# Patient Record
Sex: Male | Born: 1960 | Race: Black or African American | Hispanic: No | Marital: Married | Smoking: Never smoker
Health system: Southern US, Community
[De-identification: ages and names within clinical notes are randomized; demographics above are authoritative.]

## PROBLEM LIST (undated history)

## (undated) HISTORY — PX: CIRCUMCISION: SUR203

## (undated) HISTORY — PX: NO PAST SURGERIES: SHX2092

---

## 2014-11-28 ENCOUNTER — Encounter: Payer: Self-pay | Admitting: Gastroenterology

## 2015-01-24 ENCOUNTER — Ambulatory Visit (AMBULATORY_SURGERY_CENTER): Payer: Self-pay

## 2015-01-24 VITALS — Ht 69.0 in | Wt 196.0 lb

## 2015-01-24 DIAGNOSIS — Z1211 Encounter for screening for malignant neoplasm of colon: Secondary | ICD-10-CM

## 2015-01-24 MED ORDER — SUPREP BOWEL PREP KIT 17.5-3.13-1.6 GM/177ML PO SOLN: 1.0000 | Freq: Once | ORAL | Status: DC

## 2015-01-24 NOTE — Progress Notes (Signed)
No allergies to eggs or soy No diet/weight loss meds No home oxygen No past problems or exposure to anesthesia  No email

## 2015-02-02 ENCOUNTER — Telehealth: Payer: Self-pay | Admitting: Gastroenterology

## 2015-02-02 MED ORDER — NA SULFATE-K SULFATE-MG SULF 17.5-3.13-1.6 GM/177ML PO SOLN: 1.0000 | Freq: Once | ORAL | Status: DC

## 2015-02-02 NOTE — Telephone Encounter (Signed)
L/m for patient that prep was resent to [facility]  Looks like Darrold JunkerAngela Willet sent this in for him to the correct pharmacy on 01/24/2015

## 2015-02-07 ENCOUNTER — Encounter: Payer: Self-pay | Admitting: Gastroenterology

## 2015-02-07 ENCOUNTER — Ambulatory Visit (AMBULATORY_SURGERY_CENTER): Payer: PRIVATE HEALTH INSURANCE | Admitting: Gastroenterology

## 2015-02-07 VITALS — BP 110/75 | HR 58 | Temp 96.8°F | Resp 17 | Ht 69.0 in | Wt 196.0 lb

## 2015-02-07 DIAGNOSIS — Z1211 Encounter for screening for malignant neoplasm of colon: Secondary | ICD-10-CM | POA: Diagnosis present

## 2015-02-07 DIAGNOSIS — K648 Other hemorrhoids: Secondary | ICD-10-CM

## 2015-02-07 DIAGNOSIS — K5730 Diverticulosis of large intestine without perforation or abscess without bleeding: Secondary | ICD-10-CM

## 2015-02-07 MED ORDER — SODIUM CHLORIDE 0.9 % IV SOLN: 500.0000 mL | INTRAVENOUS | Status: DC

## 2015-02-07 NOTE — Progress Notes (Signed)
Transferred to recovery room. A/O x3, pleased with MAC.  VSS.  Report to Penny, RN. 

## 2015-02-07 NOTE — Op Note (Addendum)
[facility] [address] [place], [id number]   COLONOSCOPY PROCEDURE REPORT  PATIENT: Manuel NakaiMoore, [name]  MR#: [id number] BIRTHDATE: 10-19-1960 , 53  yrs. old GENDER: male ENDOSCOPIST: Louis Meckelobert D Henri Guedes, MD REFERRED BY: PROCEDURE DATE:  02/07/2015 PROCEDURE:   Colonoscopy, screening First Screening Colonoscopy - Avg.  risk and is 50 yrs.  old or older Yes.  Prior Negative Screening - Now for repeat screening. N/A  History of Adenoma - Now for follow-up colonoscopy & has been > or = to 3 yrs.  N/A  Polyps removed today? No Recommend repeat exam, <10 yrs? No ASA CLASS:   Class I INDICATIONS:Colorectal Neoplasm Risk Assessment for this procedure is average risk. MEDICATIONS: Monitored anesthesia care and Propofol 175 mg IV  DESCRIPTION OF PROCEDURE:   After the risks benefits and alternatives of the procedure were thoroughly explained, informed consent was obtained.  The digital rectal exam revealed no abnormalities of the rectum.   The LB [id number]  endoscope was introduced through the anus and advanced to the cecum, which was identified by both the appendix and ileocecal valve. No adverse events experienced.   The quality of the prep was (Suprep was used) excellent.  The instrument was then slowly withdrawn as the colon was fully examined. Estimated blood loss is zero unless otherwise noted in this procedure report.    COLON FINDINGS: There was mild diverticulosis noted in the ascending colon and descending colon.   The examination was otherwise normal. Internal hemorrhoids were found.  Retroflexed views revealed no abnormalities. The time to cecum = 2.5 Withdrawal time = 6.2   The scope was withdrawn and the procedure completed. COMPLICATIONS: There were no immediate complications.  ENDOSCOPIC IMPRESSION: 1.   Mild diverticulosis was noted in the ascending colon and descending colon 2.   The examination was otherwise normal  RECOMMENDATIONS: Continue  current colorectal screening recommendations for "routine risk" patients with a repeat colonoscopy in 10 years.  eSigned:  Louis Meckelobert D Ramel Tobon, MD 02/07/2015 9:22 AM Revised: 02/07/2015 9:22 AM  cc:

## 2015-02-07 NOTE — Patient Instructions (Addendum)
YOU HAD AN ENDOSCOPIC PROCEDURE TODAY AT THE [facility]:   Refer to the procedure report that was given to you for any specific questions about what was found during the examination.  If the procedure report does not answer your questions, please call your gastroenterologist to clarify.  If you requested that your care partner not be given the details of your procedure findings, then the procedure report has been included in a sealed envelope for you to review at your convenience later.  YOU SHOULD EXPECT: Some feelings of bloating in the abdomen. Passage of more gas than usual.  Walking can help get rid of the air that was put into your GI tract during the procedure and reduce the bloating. If you had a lower endoscopy (such as a colonoscopy or flexible sigmoidoscopy) you may notice spotting of blood in your stool or on the toilet paper. If you underwent a bowel prep for your procedure, you may not have a normal bowel movement for a few days.  Please Note:  You might notice some irritation and congestion in your nose or some drainage.  This is from the oxygen used during your procedure.  There is no need for concern and it should clear up in a day or so.  SYMPTOMS TO REPORT IMMEDIATELY:   Following lower endoscopy (colonoscopy or flexible sigmoidoscopy):  Excessive amounts of blood in the stool  Significant tenderness or worsening of abdominal pains  Swelling of the abdomen that is new, acute  Fever of 100F or higher    For urgent or emergent issues, a gastroenterologist can be reached at any hour by calling [phone].   DIET: Your first meal following the procedure should be a small meal and then it is ok to progress to your normal diet. Heavy or fried foods are harder to digest and may make you feel nauseous or bloated.  Likewise, meals heavy in dairy and vegetables can increase bloating.  Drink plenty of fluids but you should avoid alcoholic beverages for 24  hours.  ACTIVITY:  You should plan to take it easy for the rest of today and you should NOT DRIVE or use heavy machinery until tomorrow (because of the sedation medicines used during the test).    FOLLOW UP: Our staff will call the number listed on your records the next business day following your procedure to check on you and address any questions or concerns that you may have regarding the information given to you following your procedure. If we do not reach you, we will leave a message.  However, if you are feeling well and you are not experiencing any problems, there is no need to return our call.  We will assume that you have returned to your regular daily activities without incident.  If any biopsies were taken you will be contacted by phone or by letter within the next 1-3 weeks.  Please call us at [phone] if you have not heard about the biopsies in 3 weeks.    SIGNATURES/CONFIDENTIALITY: You and/or your care partner have signed paperwork which will be entered into your electronic medical record.  These signatures attest to the fact that that the information above on your After Visit Summary has been reviewed and is understood.  Full responsibility of the confidentiality of this discharge information lies with you and/or your care-partner.   Information on diverticulosis & high fiber diet given to you today  Over the counter hemorrhoid suppositories as needed for one wk  Banding of hemorrhoid information given to you

## 2015-02-08 ENCOUNTER — Telehealth: Payer: Self-pay | Admitting: *Deleted

## 2015-02-08 NOTE — Telephone Encounter (Signed)
  Follow up Call-  Call back number 02/07/2015  Post procedure Call Back phone  # [phone]  Permission to leave phone message Yes     Patient questions:  Do you have a fever, pain , or abdominal swelling? No. Pain Score  0 *  Have you tolerated food without any problems? Yes.    Have you been able to return to your normal activities? Yes.    Do you have any questions about your discharge instructions: Diet   No. Medications  No. Follow up visit  No.  Do you have questions or concerns about your Care? No.  Actions: * If pain score is 4 or above: No action needed, pain <4.

## 2015-03-11 ENCOUNTER — Emergency Department (HOSPITAL_COMMUNITY)
Admission: EM | Admit: 2015-03-11 | Discharge: 2015-03-11 | Disposition: A | Discharge status: Home / Self Care | Payer: No Typology Code available for payment source | Attending: Emergency Medicine | Admitting: Emergency Medicine

## 2015-03-11 DIAGNOSIS — Y92410 Unspecified street and highway as the place of occurrence of the external cause: Secondary | ICD-10-CM | POA: Diagnosis not present

## 2015-03-11 DIAGNOSIS — S46912A Strain of unspecified muscle, fascia and tendon at shoulder and upper arm level, left arm, initial encounter: Secondary | ICD-10-CM | POA: Insufficient documentation

## 2015-03-11 DIAGNOSIS — Y999 Unspecified external cause status: Secondary | ICD-10-CM | POA: Insufficient documentation

## 2015-03-11 DIAGNOSIS — Y9389 Activity, other specified: Secondary | ICD-10-CM | POA: Insufficient documentation

## 2015-03-11 DIAGNOSIS — S4992XA Unspecified injury of left shoulder and upper arm, initial encounter: Secondary | ICD-10-CM | POA: Diagnosis present

## 2015-03-11 DIAGNOSIS — T148XXA Other injury of unspecified body region, initial encounter: Secondary | ICD-10-CM

## 2015-03-11 MED ORDER — IBUPROFEN 800 MG PO TABS: 800.0000 mg | ORAL_TABLET | Freq: Three times a day (TID) | ORAL | Status: DC

## 2015-03-11 MED ORDER — CYCLOBENZAPRINE HCL 10 MG PO TABS: 10.0000 mg | ORAL_TABLET | Freq: Two times a day (BID) | ORAL | Status: DC | PRN
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
## 2015-03-21 ENCOUNTER — Encounter (HOSPITAL_COMMUNITY): Payer: Self-pay | Admitting: Emergency Medicine

## 2015-03-21 ENCOUNTER — Emergency Department (HOSPITAL_COMMUNITY)
Admission: EM | Admit: 2015-03-21 | Discharge: 2015-03-21 | Disposition: A | Discharge status: Home / Self Care | Payer: PRIVATE HEALTH INSURANCE | Attending: Emergency Medicine | Admitting: Emergency Medicine

## 2015-03-21 DIAGNOSIS — G8911 Acute pain due to trauma: Secondary | ICD-10-CM | POA: Insufficient documentation

## 2015-03-21 DIAGNOSIS — Z87898 Personal history of other specified conditions: Secondary | ICD-10-CM | POA: Insufficient documentation

## 2015-03-21 DIAGNOSIS — Z791 Long term (current) use of non-steroidal anti-inflammatories (NSAID): Secondary | ICD-10-CM | POA: Diagnosis not present

## 2015-03-21 DIAGNOSIS — M546 Pain in thoracic spine: Secondary | ICD-10-CM | POA: Insufficient documentation

## 2015-03-21 DIAGNOSIS — M25512 Pain in left shoulder: Secondary | ICD-10-CM | POA: Diagnosis not present

## 2015-03-21 MED ORDER — METHOCARBAMOL 500 MG PO TABS: 500.0000 mg | ORAL_TABLET | Freq: Three times a day (TID) | ORAL | Status: DC | PRN

## 2015-03-21 MED ORDER — TRAMADOL HCL 50 MG PO TABS: 50.0000 mg | ORAL_TABLET | Freq: Four times a day (QID) | ORAL | Status: DC | PRN
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
## 2015-03-21 NOTE — Discharge Instructions (Signed)
Back Pain, Adult Low back pain is very common. About 1 in 5 people have back pain.The cause of low back pain is rarely dangerous. The pain often gets better over time.About half of people with a sudden onset of back pain feel better in just 2 weeks. About 8 in 10 people feel better by 6 weeks.  CAUSES Some common causes of back pain include:  Strain of the muscles or ligaments supporting the spine.  Wear and tear (degeneration) of the spinal discs.  Arthritis.  Direct injury to the back. DIAGNOSIS Most of the time, the direct cause of low back pain is not known.However, back pain can be treated effectively even when the exact cause of the pain is unknown.Answering your caregiver's questions about your overall health and symptoms is one of the most accurate ways to make sure the cause of your pain is not dangerous. If your caregiver needs more information, he or she may order lab work or imaging tests (X-rays or MRIs).However, even if imaging tests show changes in your back, this usually does not require surgery. HOME CARE INSTRUCTIONS For many people, back pain returns.Since low back pain is rarely dangerous, it is often a condition that people can learn to manageon their own.   Remain active. It is stressful on the back to sit or stand in one place. Do not sit, drive, or stand in one place for more than 30 minutes at a time. Take short walks on level surfaces as soon as pain allows.Try to increase the length of time you walk each day.  Do not stay in bed.Resting more than 1 or 2 days can delay your recovery.  Do not avoid exercise or work.Your body is made to move.It is not dangerous to be active, even though your back may hurt.Your back will likely heal faster if you return to being active before your pain is gone.  Pay attention to your body when you bend and lift. Many people have less discomfortwhen lifting if they bend their knees, keep the load close to their bodies,and  avoid twisting. Often, the most comfortable positions are those that put less stress on your recovering back.  Find a comfortable position to sleep. Use a firm mattress and lie on your side with your knees slightly bent. If you lie on your back, put a pillow under your knees.  Only take over-the-counter or prescription medicines as directed by your caregiver. Over-the-counter medicines to reduce pain and inflammation are often the most helpful.Your caregiver may prescribe muscle relaxant drugs.These medicines help dull your pain so you can more quickly return to your normal activities and healthy exercise.  Put ice on the injured area.  Put ice in a plastic bag.  Place a towel between your skin and the bag.  Leave the ice on for 15-20 minutes, 03-04 times a day for the first 2 to 3 days. After that, ice and heat may be alternated to reduce pain and spasms.  Ask your caregiver about trying back exercises and gentle massage. This may be of some benefit.  Avoid feeling anxious or stressed.Stress increases muscle tension and can worsen back pain.It is important to recognize when you are anxious or stressed and learn ways to manage it.Exercise is a great option. SEEK MEDICAL CARE IF:  You have pain that is not relieved with rest or medicine.  You have pain that does not improve in 1 week.  You have new symptoms.  You are generally not feeling well. SEEK   IMMEDIATE MEDICAL CARE IF:   You have pain that radiates from your back into your legs.  You develop new bowel or bladder control problems.  You have unusual weakness or numbness in your arms or legs.  You develop nausea or vomiting.  You develop abdominal pain.  You feel faint. Document Released: 09/01/2005 Document Revised: 03/02/2012 Document Reviewed: 01/03/2014 ExitCare Patient Information 2015 [employer]. This information is not intended to replace advice given to you by your health care provider. Make sure you  discuss any questions you have with your health care provider.  
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
## 2017-07-21 ENCOUNTER — Other Ambulatory Visit: Payer: Self-pay

## 2017-07-21 ENCOUNTER — Emergency Department (HOSPITAL_COMMUNITY)
Admission: EM | Admit: 2017-07-21 | Discharge: 2017-07-21 | Disposition: A | Discharge status: Home / Self Care | Payer: PRIVATE HEALTH INSURANCE | Attending: Emergency Medicine | Admitting: Emergency Medicine

## 2017-07-21 ENCOUNTER — Encounter (HOSPITAL_COMMUNITY): Payer: Self-pay | Admitting: Emergency Medicine

## 2017-07-21 DIAGNOSIS — K0889 Other specified disorders of teeth and supporting structures: Secondary | ICD-10-CM

## 2017-07-21 DIAGNOSIS — K047 Periapical abscess without sinus: Secondary | ICD-10-CM | POA: Diagnosis not present

## 2017-07-21 DIAGNOSIS — Z79891 Long term (current) use of opiate analgesic: Secondary | ICD-10-CM | POA: Insufficient documentation

## 2017-07-21 DIAGNOSIS — Z791 Long term (current) use of non-steroidal anti-inflammatories (NSAID): Secondary | ICD-10-CM | POA: Insufficient documentation

## 2017-07-21 MED ORDER — PENICILLIN V POTASSIUM 500 MG PO TABS: 500.0000 mg | ORAL_TABLET | Freq: Four times a day (QID) | ORAL | 0 refills | Status: AC

## 2017-07-21 MED ORDER — CHLORHEXIDINE GLUCONATE 0.12 % MT SOLN: 15.0000 mL | Freq: Two times a day (BID) | OROMUCOSAL | 0 refills | Status: DC

## 2017-07-21 NOTE — Discharge Instructions (Signed)
Please take all of your antibiotics until finished!   You may develop abdominal discomfort or diarrhea from the antibiotic.  You may help offset this with probiotics which you can buy or get in yogurt. Do not eat  or take the probiotics until 2 hours after your antibiotic.  ° °Apply warm compresses to jaw throughout the day. Alternate 600 mg of ibuprofen and 500-1000 mg of Tylenol every 3 hours as needed for pain. Do not exceed 4000 mg of Tylenol daily.  You may also use warm water salt gargles, Orajel, or other over-the-counter dental pain remedies. Use Peridex mouthwash twice daily. Followup with a dentist is very important for ongoing evaluation and management of recurrent dental pain. Return to emergency department for emergent changing or worsening symptoms such as fever, worsening facial swelling, difficulty breathing or swallowing, throat tightness, or vision changes. ° °

## 2017-07-21 NOTE — ED Triage Notes (Signed)
Pt complaint of right facial abscess noted yesterday; denies denial pain; hx of same successful with I/D.

## 2017-07-21 NOTE — ED Provider Notes (Signed)
[facility]-EMERGENCY DEPT Provider Note   CSN: [id number] Arrival date & time: 07/21/17  1416     History   Chief Complaint Chief Complaint  Patient presents with  . Abscess    HPI Manuel Phillips is a 56 y.o. male who presents today with chief complaint acute onset, progressively worsening right upper dental pain and associated facial swelling since yesterday morning.  He endorses constant throbbing pain which worsens with eating and states that he needs to eat on the left side of his jaw.  Denies throat tightness, drooling, vision changes, headaches.  Pain does not radiate.  Denies fevers or chills.  He has tried Tylenol with some improvement in his pain.  He does have a dentist but has not seen his dentist for approximately 2 years.  The history is provided by the patient.    History reviewed. No pertinent past medical history.  There are no active problems to display for this patient.   Past Surgical History:  Procedure Laterality Date  . CIRCUMCISION     at age 34  . NO PAST SURGERIES         Home Medications    Prior to Admission medications   Medication Sig Start Date End Date Taking? Authorizing Provider  chlorhexidine (PERIDEX) 0.12 % solution Use as directed 15 mLs 2 (two) times daily in the mouth or throat. 07/21/17   Tressy Kunzman A, PA-C  ibuprofen (ADVIL,MOTRIN) 800 MG tablet Take 1 tablet (800 mg total) by mouth 3 (three) times daily. 03/11/15   Elpidio Anis, PA-C  methocarbamol (ROBAXIN) 500 MG tablet Take 1 tablet (500 mg total) by mouth every 8 (eight) hours as needed for muscle spasms. 03/21/15   Raeford Razor, MD  penicillin v potassium (VEETID) 500 MG tablet Take 1 tablet (500 mg total) 4 (four) times daily for 7 days by mouth. 07/21/17 07/28/17  Michela Pitcher A, PA-C  traMADol (ULTRAM) 50 MG tablet Take 1 tablet (50 mg total) by mouth every 6 (six) hours as needed. 03/21/15   Raeford Razor, MD    Family History Family History    Problem Relation Age of Onset  . Colon cancer Neg Hx     Social History Social History   Tobacco Use  . Smoking status: Never Smoker  . Smokeless tobacco: Never Used  Substance Use Topics  . Alcohol use: No    Alcohol/week: 0.0 oz  . Drug use: No     Allergies   Patient has no known allergies.   Review of Systems Review of Systems  Constitutional: Negative for chills and fever.  HENT: Positive for dental problem and facial swelling. Negative for congestion, ear pain, rhinorrhea, sinus pain, sore throat and trouble swallowing.   Eyes: Negative for visual disturbance.  Neurological: Negative for headaches.     Physical Exam Updated Vital Signs BP (!) 139/99 (BP Location: Left Arm)   Pulse 89   Temp 98.4 F (36.9 C) (Oral)   Resp 18   SpO2 97%   Physical Exam  Constitutional: He appears well-developed and well-nourished. No distress.  HENT:  Head: Normocephalic and atraumatic.  Right Ear: External ear normal.  Left Ear: External ear normal.  Nose: Nose normal.  Right-sided facial swelling of the upper cheek.  Mildly tender to palpation overlying the maxilla and ecchymotic arch.  No erythema or warmth noted.  Diffusely decaying dentition with cracked right upper canine.  Tender to percussion in this area.  Moderate signs of gingival irritation.  No trismus or sublingual abnormalities.  Posterior oropharynx is unremarkable.  Eyes: Conjunctivae and EOM are normal. Pupils are equal, round, and reactive to light. Right eye exhibits no discharge. Left eye exhibits no discharge.  Neck: Normal range of motion. Neck supple. No tracheal deviation present.  Cardiovascular: Normal rate.  Pulmonary/Chest: Effort normal.  Abdominal: He exhibits no distension.  Musculoskeletal: He exhibits no edema.  Lymphadenopathy:    He has no cervical adenopathy.  Neurological: He is alert.  Skin: Skin is warm and dry. No erythema.  Psychiatric: He has a normal mood and affect. His  behavior is normal.  Nursing note and vitals reviewed.    ED Treatments / Results  Labs (all labs ordered are listed, but only abnormal results are displayed) Labs Reviewed - No data to display  EKG  EKG Interpretation None       Radiology No results found.  Procedures Procedures (including critical care time)  Medications Ordered in ED Medications - No data to display   Initial Impression / Assessment and Plan / ED Course  I have reviewed the triage vital signs and the nursing notes.  Pertinent labs & imaging results that were available during my care of the patient were reviewed by me and considered in my medical decision making (see chart for details).      Dental pain associated with dental infection and possible dental abscess with patient afebrile, non toxic appearing and swallowing secretions well.  Airway is patent.  Will discharge with penicillin and Peridex mouthwash.  I gave patient referral to dentist and stressed the importance of dental follow up for ultimate management of dental pain.  He has a Education officer, communitydentist with whom he can follow-up with.  I have also discussed reasons to return immediately to the ER.  Patient and patient's wife verbalized understanding of and patient stable for discharge home at this time.  He has no complaints prior to discharge.   Final Clinical Impressions(s) / ED Diagnoses   Final diagnoses:  Pain, dental    ED Discharge Orders        Ordered    chlorhexidine (PERIDEX) 0.12 % solution  2 times daily     07/21/17 1454    penicillin v potassium (VEETID) 500 MG tablet  4 times daily     07/21/17 1454       Jeanie SewerFawze, Bobbiejo Ishikawa A, PA-C 07/21/17 1456    Azalia Bilisampos, Kevin, MD 07/22/17 2100

## 2019-01-28 ENCOUNTER — Other Ambulatory Visit: Payer: Self-pay

## 2019-01-28 ENCOUNTER — Encounter (HOSPITAL_COMMUNITY): Payer: Self-pay

## 2019-01-28 ENCOUNTER — Emergency Department (HOSPITAL_COMMUNITY)
Admission: EM | Admit: 2019-01-28 | Discharge: 2019-01-28 | Disposition: A | Discharge status: Home / Self Care | Payer: PRIVATE HEALTH INSURANCE | Attending: Emergency Medicine | Admitting: Emergency Medicine

## 2019-01-28 DIAGNOSIS — L259 Unspecified contact dermatitis, unspecified cause: Secondary | ICD-10-CM | POA: Insufficient documentation

## 2019-01-28 DIAGNOSIS — Z79899 Other long term (current) drug therapy: Secondary | ICD-10-CM | POA: Diagnosis not present

## 2019-01-28 DIAGNOSIS — R21 Rash and other nonspecific skin eruption: Secondary | ICD-10-CM | POA: Diagnosis present

## 2019-01-28 MED ORDER — PREDNISONE 10 MG (21) PO TBPK: ORAL_TABLET | Freq: Every day | ORAL | 0 refills | Status: DC

## 2019-01-28 MED ORDER — HYDROXYZINE HCL 10 MG PO TABS: 10.0000 mg | ORAL_TABLET | Freq: Four times a day (QID) | ORAL | 0 refills | Status: DC | PRN

## 2019-01-28 NOTE — ED Triage Notes (Signed)
Patient  c/o rash on right arm, shoulders, and back x 1 week.

## 2019-01-28 NOTE — ED Notes (Signed)
Bed: WTR5 Expected date:  Expected time:  Means of arrival:  Comments: 

## 2019-01-28 NOTE — Discharge Instructions (Addendum)
Follow up with your doctor return to ER for worsening or concerning symptoms.

## 2019-01-28 NOTE — ED Provider Notes (Signed)
[facility]-EMERGENCY DEPT Provider Note   CSN: [id number] Arrival date & time: 01/28/19  1451    History   Chief Complaint Chief Complaint  Patient presents with  . Rash    HPI Manuel Phillips is a 58 y.o. male.     [age] year old male with complaint of pruritic rash x3 to 4 days.  Rash first started on the right upper arm, spread to his low back and left shoulder.  Patient has been applying alcohol which helps with itching.  Denies changes in medications, soaps or detergent, contact with anyone with a similar rash.  No known allergies.  No history of diabetes.  No other complaints or concerns.     History reviewed. No pertinent past medical history.  There are no active problems to display for this patient.   Past Surgical History:  Procedure Laterality Date  . CIRCUMCISION     at age 58  . NO PAST SURGERIES          Home Medications    Prior to Admission medications   Medication Sig Start Date End Date Taking? Authorizing Provider  chlorhexidine (PERIDEX) 0.12 % solution Use as directed 15 mLs 2 (two) times daily in the mouth or throat. 07/21/17   Fawze, Mina A, PA-C  hydrOXYzine (ATARAX/VISTARIL) 10 MG tablet Take 1 tablet (10 mg total) by mouth every 6 (six) hours as needed for itching. 01/28/19   Jeannie FendMurphy, Kimberlin Scheel A, PA-C  ibuprofen (ADVIL,MOTRIN) 800 MG tablet Take 1 tablet (800 mg total) by mouth 3 (three) times daily. 03/11/15   Elpidio AnisUpstill, Shari, PA-C  methocarbamol (ROBAXIN) 500 MG tablet Take 1 tablet (500 mg total) by mouth every 8 (eight) hours as needed for muscle spasms. 03/21/15   Raeford RazorKohut, Stephen, MD  predniSONE (STERAPRED UNI-PAK 21 TAB) 10 MG (21) TBPK tablet Take by mouth daily. Take 6 tabs by mouth daily  for 2 days, then 5 tabs for 2 days, then 4 tabs for 2 days, then 3 tabs for 2 days, 2 tabs for 2 days, then 1 tab by mouth daily for 2 days 01/28/19   Jeannie FendMurphy, Kimika Streater A, PA-C  traMADol (ULTRAM) 50 MG tablet Take 1 tablet (50 mg total) by mouth  every 6 (six) hours as needed. 03/21/15   Raeford RazorKohut, Stephen, MD    Family History Family History  Problem Relation Age of Onset  . Heart failure Father   . Colon cancer Neg Hx     Social History Social History   Tobacco Use  . Smoking status: Never Smoker  . Smokeless tobacco: Never Used  Substance Use Topics  . Alcohol use: No    Alcohol/week: 0.0 standard drinks  . Drug use: No     Allergies   Patient has no known allergies.   Review of Systems Review of Systems  Constitutional: Negative for fever.  Musculoskeletal: Negative for arthralgias, joint swelling and myalgias.  Skin: Positive for rash. Negative for wound.  Allergic/Immunologic: Negative for immunocompromised state.  Neurological: Negative for weakness.  Hematological: Negative for adenopathy.  All other systems reviewed and are negative.    Physical Exam Updated Vital Signs BP 126/84 (BP Location: Right Arm)   Pulse 87   Temp 98.1 F (36.7 C) (Oral)   Resp 16   Ht 5\' 8"  (1.727 m)   Wt 86.2 kg   SpO2 99%   BMI 28.89 kg/m   Physical Exam Vitals signs and nursing note reviewed.  Constitutional:      General: He  is not in acute distress.    Appearance: He is well-developed. He is not diaphoretic.  HENT:     Head: Normocephalic and atraumatic.  Pulmonary:     Effort: Pulmonary effort is normal.  Skin:    General: Skin is warm and dry.     Findings: Rash present. No erythema.     Comments: Papular rash to right upper arm, across low back, left posterior shoulder.  Neurological:     Mental Status: He is alert and oriented to person, place, and time.  Psychiatric:        Behavior: Behavior normal.      ED Treatments / Results  Labs (all labs ordered are listed, but only abnormal results are displayed) Labs Reviewed - No data to display  EKG None  Radiology No results found.  Procedures Procedures (including critical care time)  Medications Ordered in ED Medications - No data to  display   Initial Impression / Assessment and Plan / ED Course  I have reviewed the triage vital signs and the nursing notes.  Pertinent labs & imaging results that were available during my care of the patient were reviewed by me and considered in my medical decision making (see chart for details).  Clinical Course as of Jan 27 1521  Fri Jan 28, 2019  1520 58 yo male with rash x 3-4 days, on exam papular rash to right upper arm, across low back, left posterior shoulder without signs of secondary infection. Patient given prednisone dose pack with atarax for itching, recommend recheck with PCP if rash persists, return to ER for worsening or new symptoms.    [LM]    Clinical Course User Index [LM] Jeannie Fend, PA-C      Final Clinical Impressions(s) / ED Diagnoses   Final diagnoses:  Contact dermatitis, unspecified contact dermatitis type, unspecified trigger    ED Discharge Orders         Ordered    predniSONE (STERAPRED UNI-PAK 21 TAB) 10 MG (21) TBPK tablet  Daily     01/28/19 1516    hydrOXYzine (ATARAX/VISTARIL) 10 MG tablet  Every 6 hours PRN     01/28/19 1516           Jeannie Fend, PA-C 01/28/19 1522    Melene Plan, DO 01/31/19 1123

## 2019-08-04 ENCOUNTER — Ambulatory Visit: Payer: Self-pay | Admitting: Family Medicine

## 2019-08-04 NOTE — Progress Notes (Signed)
Virtual Visit via Telephone Note  I connected with Manuel Phillips on 08/04/19 at 25 AM by telephone and verified that I am speaking with the correct person using two identifiers.  Location: Patient: Home  Provider: Clinic   I discussed the limitations, risks, security and privacy concerns of performing an evaluation and management service by telephone and the availability of in person appointments. I also discussed with the patient that there may be a patient responsible charge related to this service. The patient expressed understanding and agreed to proceed.   History of Present Illness: Manuel Phillips reports that he has been having a sore throat and cough since yesterday. Denies fever or shortness of breath. No chest pain. Reports nose bleed this morning. Stopped easily. He states he is concerned about Covid-19 and is heading to a clinic to be tested currently.    Observations/Objective: He reports temp this morning was 97.5. He is speaking in complete sentences without difficulty. He is alert and oriented. Answering questions appropriately.   Assessment and Plan: 1. Recommend Covid-19 testing. He is headed to a testing site now. He will call me with the results when he has that available.  2. He may take otc robitussin for cough relief. Saline nasal spray for congestion. Recommend humidifier at night. Increase fluid intake.  3. He should go to the ED if he develops shortness of breath, chest pain, altered mental status. He verbalized understanding. 4. Recommend quarantine at home until results are known. Wear mask when necessary. Good hand hygiene.   Follow Up Instructions:    I discussed the assessment and treatment plan with the patient. The patient was provided an opportunity to ask questions and all were answered. The patient agreed with the plan and demonstrated an understanding of the instructions.   The patient was advised to call back or seek an in-person evaluation if the symptoms  worsen or if the condition fails to improve as anticipated.  I provided 10 minutes of non-face-to-face time during this encounter.   Cheyenne Adas, NP

## 2020-01-05 ENCOUNTER — Encounter (HOSPITAL_COMMUNITY): Payer: Self-pay | Admitting: Emergency Medicine

## 2020-01-05 ENCOUNTER — Other Ambulatory Visit: Payer: Self-pay

## 2020-01-05 ENCOUNTER — Emergency Department (HOSPITAL_COMMUNITY): Payer: PRIVATE HEALTH INSURANCE

## 2020-01-05 ENCOUNTER — Emergency Department (HOSPITAL_COMMUNITY)
Admission: EM | Admit: 2020-01-05 | Discharge: 2020-01-06 | Disposition: A | Discharge status: Home / Self Care | Payer: Self-pay | Attending: Emergency Medicine | Admitting: Emergency Medicine

## 2020-01-05 DIAGNOSIS — M545 Low back pain, unspecified: Secondary | ICD-10-CM

## 2020-01-05 DIAGNOSIS — R3129 Other microscopic hematuria: Secondary | ICD-10-CM | POA: Insufficient documentation

## 2020-01-05 DIAGNOSIS — R1032 Left lower quadrant pain: Secondary | ICD-10-CM | POA: Insufficient documentation

## 2020-01-05 DIAGNOSIS — R109 Unspecified abdominal pain: Secondary | ICD-10-CM

## 2020-01-05 LAB — BASIC METABOLIC PANEL
Anion gap: 9 (ref 5–15)
BUN: 21 mg/dL — ABNORMAL HIGH (ref 6–20)
CO2: 27 mmol/L (ref 22–32)
Calcium: 9.1 mg/dL (ref 8.9–10.3)
Chloride: 104 mmol/L (ref 98–111)
Creatinine, Ser: 1.35 mg/dL — ABNORMAL HIGH (ref 0.61–1.24)
GFR calc Af Amer: >60 mL/min (ref >60)
GFR calc non Af Amer: 58 mL/min — ABNORMAL LOW (ref >60)
Glucose, Bld: 151 mg/dL — ABNORMAL HIGH (ref 70–99)
Potassium: 3.9 mmol/L (ref 3.5–5.1)
Sodium: 140 mmol/L (ref 135–145)

## 2020-01-05 LAB — CBC
HCT: 47.5 % (ref 39.0–52.0)
Hemoglobin: 15.5 g/dL (ref 13.0–17.0)
MCH: 28.2 pg (ref 26.0–34.0)
MCHC: 32.6 g/dL (ref 30.0–36.0)
MCV: 86.5 fL (ref 80.0–100.0)
Platelets: 247 10*3/uL (ref 150–400)
RBC: 5.49 MIL/uL (ref 4.22–5.81)
RDW: 15.2 % (ref 11.5–15.5)
WBC: 5.9 10*3/uL (ref 4.0–10.5)
nRBC: 0 % (ref 0.0–0.2)

## 2020-01-05 LAB — TROPONIN I (HIGH SENSITIVITY): Troponin I (High Sensitivity): 10 ng/L (ref <18)

## 2020-01-05 LAB — URINALYSIS, ROUTINE W REFLEX MICROSCOPIC
Bilirubin Urine: NEGATIVE
Glucose, UA: NEGATIVE mg/dL
Ketones, ur: 5 mg/dL — AB
Leukocytes,Ua: NEGATIVE
Nitrite: NEGATIVE
Protein, ur: NEGATIVE mg/dL
Specific Gravity, Urine: 1.031 — ABNORMAL HIGH (ref 1.005–1.030)
pH: 5 (ref 5.0–8.0)

## 2020-01-05 MED ORDER — SODIUM CHLORIDE 0.9% FLUSH: 3.0000 mL | Freq: Once | INTRAVENOUS | Status: DC

## 2020-01-05 NOTE — ED Triage Notes (Signed)
Patient complaining of back pain that is wrapping around to his mid chest. Patient states that the pain started on Monday.

## 2020-01-06 ENCOUNTER — Emergency Department (HOSPITAL_COMMUNITY): Payer: PRIVATE HEALTH INSURANCE

## 2020-01-06 MED ORDER — CYCLOBENZAPRINE HCL 10 MG PO TABS
10.0000 mg | ORAL_TABLET | Freq: Once | ORAL | Status: AC
  Administered 2020-01-06: 10 mg via ORAL
  Filled 2020-01-06: qty 1

## 2020-01-06 MED ORDER — HYDROCODONE-ACETAMINOPHEN 5-325 MG PO TABS
1.0000 | ORAL_TABLET | Freq: Once | ORAL | Status: AC
Start: 2020-01-06 — End: 2020-01-06
  Administered 2020-01-06: 1 via ORAL
  Filled 2020-01-06: qty 1

## 2020-01-06 MED ORDER — CYCLOBENZAPRINE HCL 10 MG PO TABS: 10.0000 mg | ORAL_TABLET | Freq: Three times a day (TID) | ORAL | 0 refills | Status: DC | PRN

## 2020-01-06 MED ORDER — HYDROCODONE-ACETAMINOPHEN 5-325 MG PO TABS: 1.0000 | ORAL_TABLET | Freq: Four times a day (QID) | ORAL | 0 refills | Status: DC | PRN

## 2020-01-06 NOTE — ED Provider Notes (Signed)
[facility] DEPT Provider Note: Georgena Spurling, MD, FACEP  CSN: [id number] MRN: [id number] ARRIVAL: 01/05/20 at 2029 ROOM: [place]  Back Pain   HISTORY OF PRESENT ILLNESS  01/06/20 12:38 AM Manuel Phillips is a 59 y.o. male with a 4-day history of back pain.  The back pain is located in the mid lumbar region.  He rates it as a 10 out of 10 at its worst.  It is exacerbated by movement.  It radiates around to his left lower ribs.  He describes the pain is aching in nature.  He finds it difficult to sleep because he cannot get a comfortable position.  He has no pain in his anterior chest or in his abdomen.  He is not short of breath.  He has been taking naproxen without adequate relief.    History reviewed. No pertinent past medical history.  Past Surgical History:  Procedure Laterality Date  . CIRCUMCISION     at age 9  . NO PAST SURGERIES      Family History  Problem Relation Age of Onset  . Heart failure Father   . Colon cancer Neg Hx     Social History   Tobacco Use  . Smoking status: Never Smoker  . Smokeless tobacco: Never Used  Substance Use Topics  . Alcohol use: No    Alcohol/week: 0.0 standard drinks  . Drug use: No    Prior to Admission medications   Medication Sig Start Date End Date Taking? Authorizing Provider  cyclobenzaprine (FLEXERIL) 10 MG tablet Take 1 tablet (10 mg total) by mouth 3 (three) times daily as needed for muscle spasms. 01/06/20   Valerya Maxton, MD  HYDROcodone-acetaminophen (NORCO) 5-325 MG tablet Take 1 tablet by mouth every 6 (six) hours as needed (for pain). 01/06/20   Kendryck Lacroix, MD    Allergies Daucus carota   REVIEW OF SYSTEMS  Negative except as noted here or in the History of Present Illness.   PHYSICAL EXAMINATION  Initial Vital Signs Blood pressure (!) 154/106, pulse 90, temperature 98 F (36.7 C), temperature source Oral, resp. rate (!) 23, height 5\' 8"  (1.727 m), weight 89.4 kg, SpO2 100  %.  Examination General: Well-developed, well-nourished male in no acute distress; appearance consistent with age of record HENT: normocephalic; atraumatic Eyes: pupils equal, round and reactive to light; extraocular muscles intact Neck: supple Heart: regular rate and rhythm Lungs: clear to auscultation bilaterally Chest: Left lower posterior lateral chest wall tenderness Back: Mid lumbar tenderness Abdomen: soft; nondistended; nontender; no masses or hepatosplenomegaly; bowel sounds present Extremities: No deformity; full range of motion; pulses normal Neurologic: Awake, alert and oriented; motor function intact in all extremities and symmetric; no facial droop Skin: Warm and dry Psychiatric: Normal mood and affect   RESULTS  Summary of this visit's results, reviewed and interpreted by myself:   EKG Interpretation  Date/Time:    Ventricular Rate:    PR Interval:    QRS Duration:   QT Interval:    QTC Calculation:   R Axis:     Text Interpretation:        Laboratory Studies: Results for orders placed or performed during the hospital encounter of 01/05/20 (from the past 24 hour(s))  Basic metabolic panel     Status: Abnormal   Collection Time: 01/05/20  8:56 PM  Result Value Ref Range   Sodium 140 135 - 145 mmol/L   Potassium 3.9 3.5 - 5.1 mmol/L   Chloride  104 98 - 111 mmol/L   CO2 27 22 - 32 mmol/L   Glucose, Bld 151 (H) 70 - 99 mg/dL   BUN 21 (H) 6 - 20 mg/dL   Creatinine, Ser 1.77 (H) 0.61 - 1.24 mg/dL   Calcium 9.1 8.9 - 93.9 mg/dL   GFR calc non Af Amer 58 (L) >60 mL/min   GFR calc Af Amer >60 >60 mL/min   Anion gap 9 5 - 15  CBC     Status: None   Collection Time: 01/05/20  8:56 PM  Result Value Ref Range   WBC 5.9 4.0 - 10.5 K/uL   RBC 5.49 4.22 - 5.81 MIL/uL   Hemoglobin 15.5 13.0 - 17.0 g/dL   HCT 03.0 09.2 - 33.0 %   MCV 86.5 80.0 - 100.0 fL   MCH 28.2 26.0 - 34.0 pg   MCHC 32.6 30.0 - 36.0 g/dL   RDW 07.6 22.6 - 33.3 %   Platelets 247 150 - 400  K/uL   nRBC 0.0 0.0 - 0.2 %  Troponin I (High Sensitivity)     Status: None   Collection Time: 01/05/20  8:56 PM  Result Value Ref Range   Troponin I (High Sensitivity) 10 <18 ng/L  Urinalysis, Routine w reflex microscopic     Status: Abnormal   Collection Time: 01/05/20 10:42 PM  Result Value Ref Range   Color, Urine YELLOW YELLOW   APPearance CLEAR CLEAR   Specific Gravity, Urine 1.031 (H) 1.005 - 1.030   pH 5.0 5.0 - 8.0   Glucose, UA NEGATIVE NEGATIVE mg/dL   Hgb urine dipstick MODERATE (A) NEGATIVE   Bilirubin Urine NEGATIVE NEGATIVE   Ketones, ur 5 (A) NEGATIVE mg/dL   Protein, ur NEGATIVE NEGATIVE mg/dL   Nitrite NEGATIVE NEGATIVE   Leukocytes,Ua NEGATIVE NEGATIVE   RBC / HPF 21-50 0 - 5 RBC/hpf   WBC, UA 0-5 0 - 5 WBC/hpf   Bacteria, UA RARE (A) NONE SEEN   Mucus PRESENT    Imaging Studies: DG Chest 2 View  Result Date: 01/05/2020 CLINICAL DATA:  Chest pain tonight. EXAM: CHEST - 2 VIEW COMPARISON:  None. FINDINGS: Cardiac silhouette is normal in size. No mediastinal or hilar masses or evidence of adenopathy. Clear lungs.  No pleural effusion or pneumothorax. Skeletal structures are unremarkable. IMPRESSION: No active cardiopulmonary disease. Electronically Signed   By: Amie Portland M.D.   On: 01/05/2020 20:51   CT Renal Stone Study  Result Date: 01/06/2020 CLINICAL DATA:  Flank pain. Patient reports back pain for 4 days. 1 Microhematuria. EXAM: CT ABDOMEN AND PELVIS WITHOUT CONTRAST TECHNIQUE: Multidetector CT imaging of the abdomen and pelvis was performed following the standard protocol without IV contrast. COMPARISON:  None. FINDINGS: Lower chest: Minor right lower lobe atelectasis. No pleural fluid. Hepatobiliary: No focal liver abnormality is seen. Elongated left lobe. Gallbladder is nondistended. No gallstones, gallbladder wall thickening, or biliary dilatation. Pancreas: No ductal dilatation or inflammation. Spleen: Normal in size without focal abnormality.  Adrenals/Urinary Tract: No adrenal nodule. No renal stones or hydronephrosis. No significant perinephric edema. No obvious focal renal mass on noncontrast exam. Both ureters are decompressed without stones along the course. Urinary bladder is physiologically distended. No bladder wall thickening or stone. No urethral stone. Stomach/Bowel: Ingested material in the stomach. No bowel obstruction or inflammation. Normal appendix. Moderate colonic stool burden. No colonic wall thickening. Mild sigmoid colonic tortuosity. Vascular/Lymphatic: Mild aortic atherosclerosis without aneurysm. Small retroperitoneal nodes, not enlarged by size criteria. No bulky  adenopathy. Reproductive: Prominent prostate gland spans 5.4 cm. Other: No free air, free fluid, or intra-abdominal fluid collection. Fall fat containing umbilical hernia. Musculoskeletal: There are no acute or suspicious osseous abnormalities. Partial fusion of the right sacroiliac joint. IMPRESSION: 1. No renal stones or obstructive uropathy. No acute abnormality in the abdomen/pelvis. 2. Mildly enlarged prostate gland. Aortic Atherosclerosis (ICD10-I70.0). Electronically Signed   By: Narda Rutherford M.D.   On: 01/06/2020 01:18    ED COURSE and MDM  Nursing notes, initial and subsequent vitals signs, including pulse oximetry, reviewed and interpreted by myself.  Vitals:   01/05/20 2040  BP: (!) 154/106  Pulse: 90  Resp: (!) 23  Temp: 98 F (36.7 C)  TempSrc: Oral  SpO2: 100%  Weight: 89.4 kg  Height: 5\' 8"  (1.727 m)   Medications  sodium chloride flush (NS) 0.9 % injection 3 mL (has no administration in time range)   1:38 AM Patient's pain is musculoskeletal in nature, originating in his mid lumbar region but also involving his left flank.  A CT scan shows no evidence of left renal stone despite microscopic hematuria on urinalysis.  We will treat for musculoskeletal pain and refer to urology for work-up of the hematuria.   PROCEDURES   Procedures   ED DIAGNOSES     ICD-10-CM   1. Acute midline low back pain without sciatica  M54.5   2. Left flank pain  R10.9   3. Microscopic hematuria  R31.29        , MD 01/06/20 [phone]

## 2021-03-03 IMAGING — CT CT RENAL STONE PROTOCOL
2 of 4 series · 16 of 46 positions shown, 18 images · non-contrast
Comparison: None.

CLINICAL DATA: Flank pain. Patient reports back pain for 4 days. 1
Microhematuria.

EXAM:
CT ABDOMEN AND PELVIS WITHOUT CONTRAST
TECHNIQUE: Multidetector CT imaging of the abdomen and pelvis was performed
following the standard protocol without IV contrast.

[Series 2: axial st · axial · 0.66mm/px · z∈[+1150,+1526]mm · 13 of 85 slices shown, 15 images]
[im 5/85  soft-tissue]
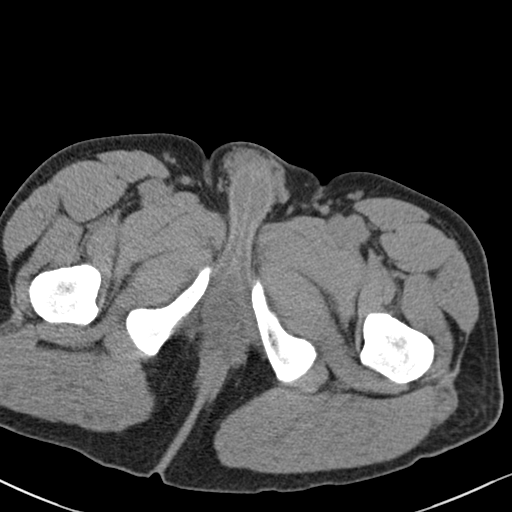
[im 5/85  bone]
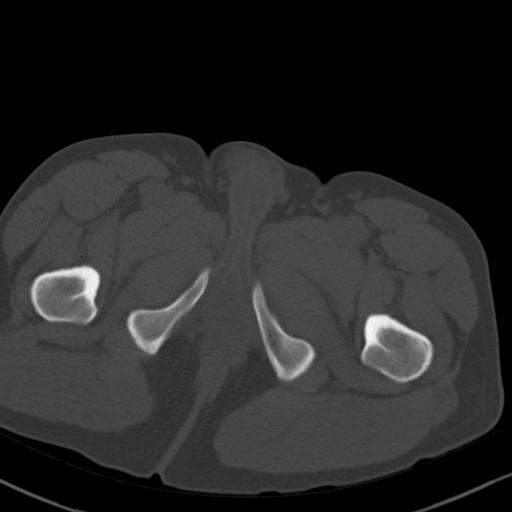
[im 13/85  soft-tissue]
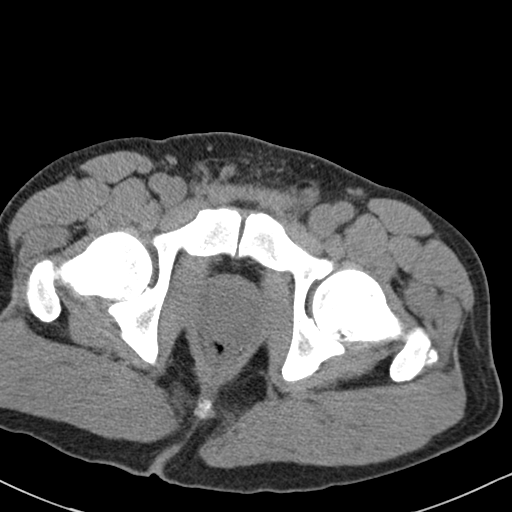
[im 17/85  soft-tissue]
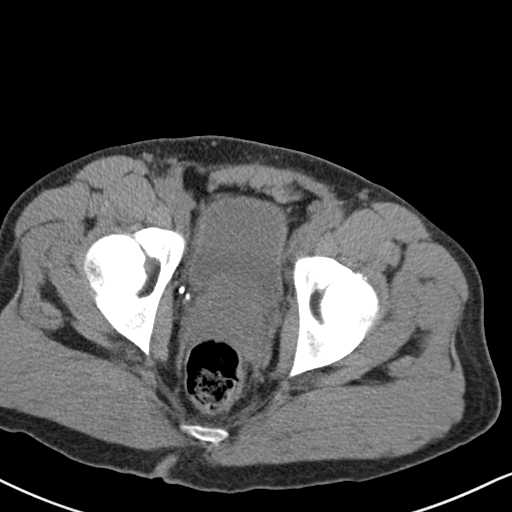
[im 26/85  soft-tissue]
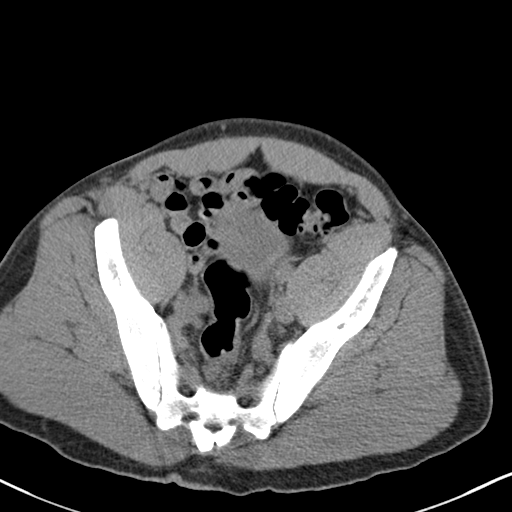
[im 30/85  soft-tissue]
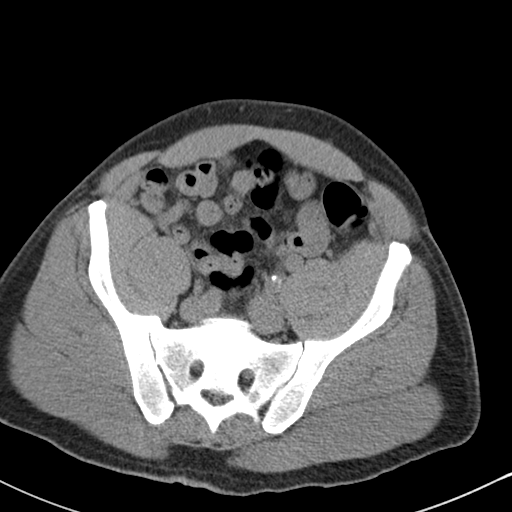
[im 38/85  soft-tissue]
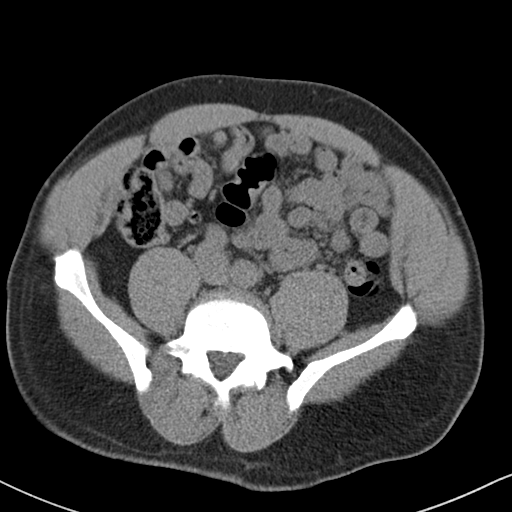
[im 43/85  soft-tissue]
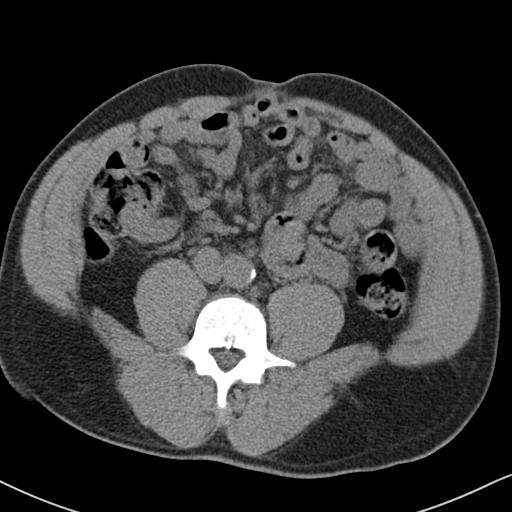
[im 47/85  soft-tissue]
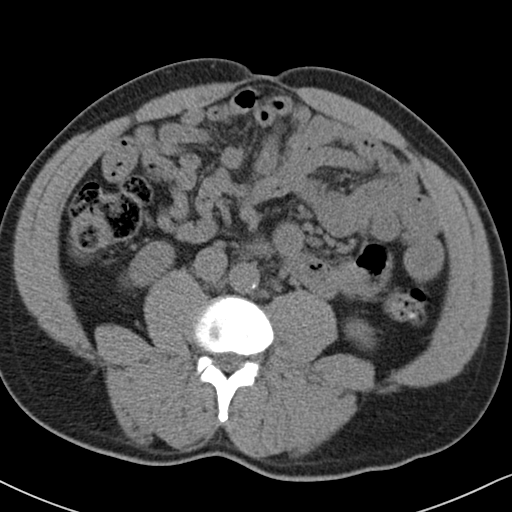
[im 55/85  soft-tissue]
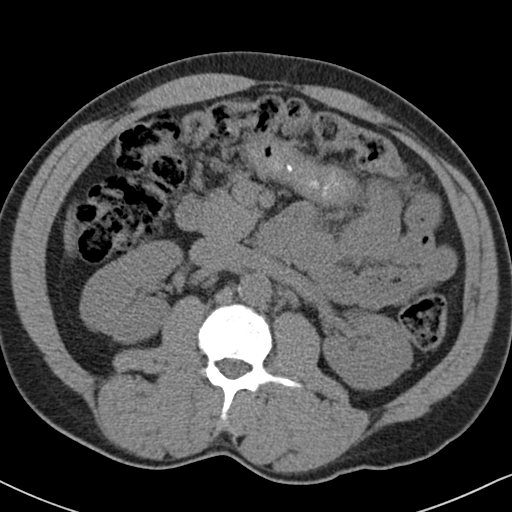
[im 55/85  bone]
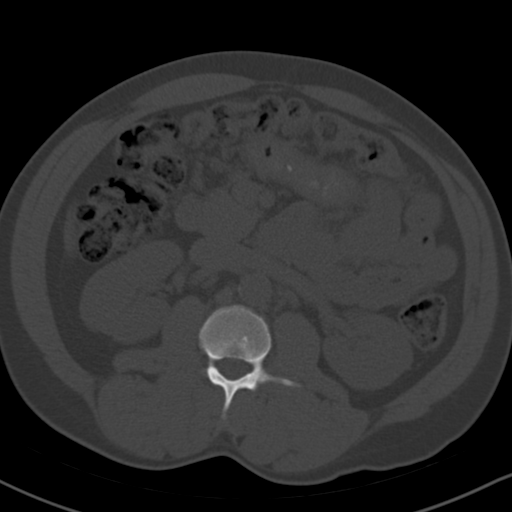
[im 59/85  soft-tissue]
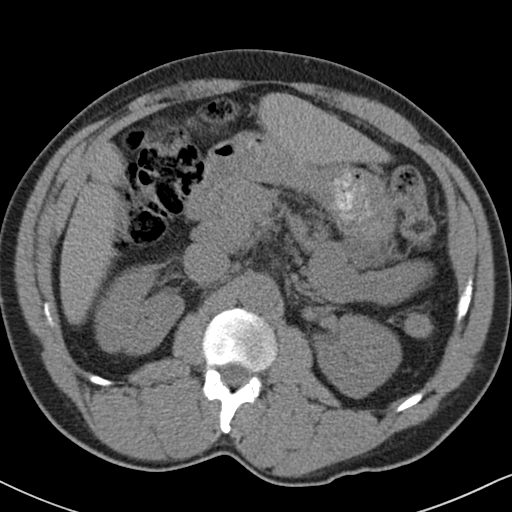
[im 68/85  soft-tissue]
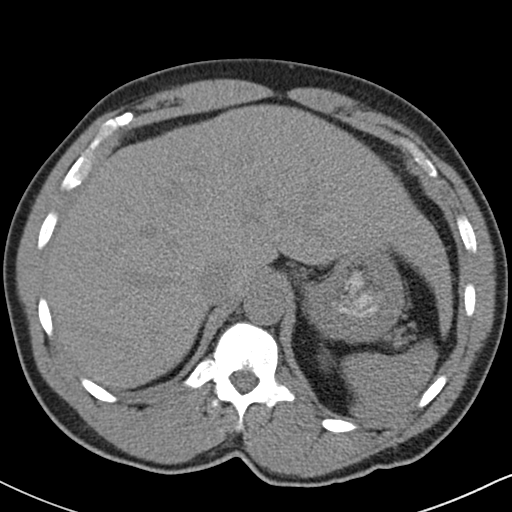
[im 72/85  soft-tissue]
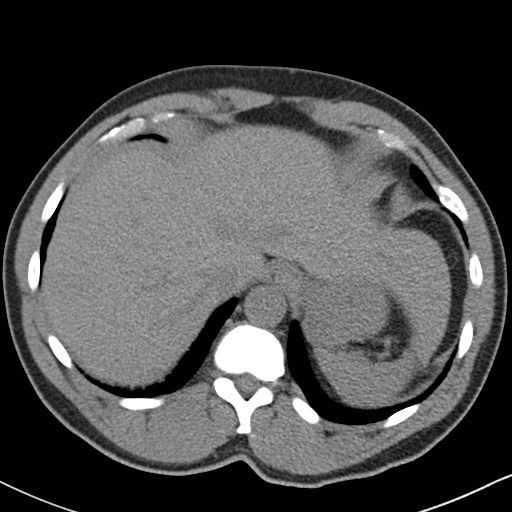
[im 80/85  soft-tissue]
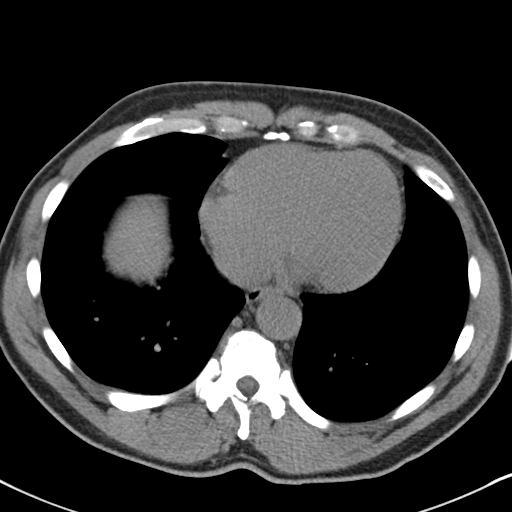

[Series 4: coronal · coronal · 0.71mm/px · 3 of 135 slices shown]
[im 45/135  soft-tissue]
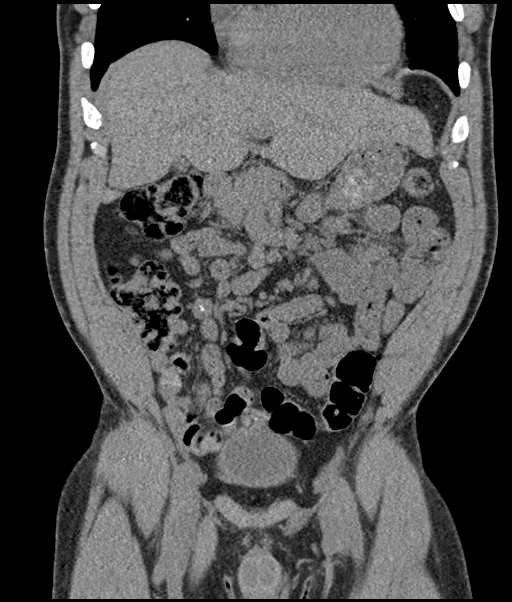
[im 60/135  soft-tissue]
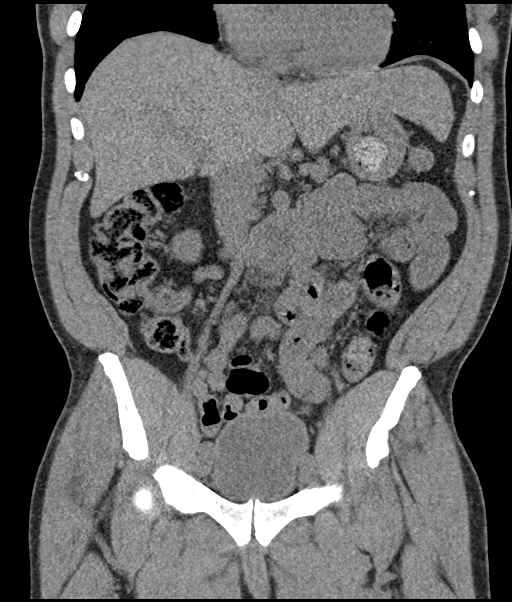
[im 75/135  soft-tissue]
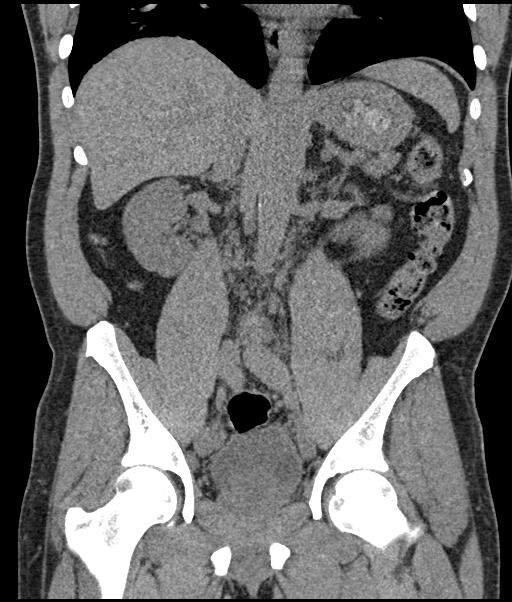

[16 of 46 positions shown; findings below may reference images not displayed]

FINDINGS: Lower chest: Minor right lower lobe atelectasis. No pleural fluid.

Hepatobiliary: No focal liver abnormality is seen. Elongated left
lobe. Gallbladder is nondistended. No gallstones, gallbladder wall
thickening, or biliary dilatation.

Pancreas: No ductal dilatation or inflammation.

Spleen: Normal in size without focal abnormality.

Adrenals/Urinary Tract: No adrenal nodule. No renal stones or
hydronephrosis. No significant perinephric edema. No obvious focal
renal mass on noncontrast exam. Both ureters are decompressed
without stones along the course. Urinary bladder is physiologically
distended. No bladder wall thickening or stone. No urethral stone.

Stomach/Bowel: Ingested material in the stomach. No bowel
obstruction or inflammation. Normal appendix. Moderate colonic stool
burden. No colonic wall thickening. Mild sigmoid colonic tortuosity.

Vascular/Lymphatic: Mild aortic atherosclerosis without aneurysm.
Small retroperitoneal nodes, not enlarged by size criteria. No bulky
adenopathy.

Reproductive: Prominent prostate gland spans 5.4 cm.

Other: No free air, free fluid, or intra-abdominal fluid collection.
Fall fat containing umbilical hernia.

Musculoskeletal: There are no acute or suspicious osseous
abnormalities. Partial fusion of the right sacroiliac joint.
IMPRESSION: 1. No renal stones or obstructive uropathy. No acute abnormality in
the abdomen/pelvis.
2. Mildly enlarged prostate gland.

Aortic Atherosclerosis ([id number]).

## 2023-05-17 ENCOUNTER — Other Ambulatory Visit: Payer: Self-pay

## 2023-05-17 ENCOUNTER — Encounter (HOSPITAL_BASED_OUTPATIENT_CLINIC_OR_DEPARTMENT_OTHER): Payer: Self-pay

## 2023-05-17 ENCOUNTER — Emergency Department (HOSPITAL_BASED_OUTPATIENT_CLINIC_OR_DEPARTMENT_OTHER)
Admission: EM | Admit: 2023-05-17 | Discharge: 2023-05-17 | Disposition: A | Discharge status: Home / Self Care | Payer: 59 | Attending: Emergency Medicine | Admitting: Emergency Medicine

## 2023-05-17 ENCOUNTER — Emergency Department (HOSPITAL_BASED_OUTPATIENT_CLINIC_OR_DEPARTMENT_OTHER): Payer: 59

## 2023-05-17 DIAGNOSIS — B350 Tinea barbae and tinea capitis: Secondary | ICD-10-CM | POA: Insufficient documentation

## 2023-05-17 DIAGNOSIS — R220 Localized swelling, mass and lump, head: Secondary | ICD-10-CM | POA: Diagnosis not present

## 2023-05-17 DIAGNOSIS — R590 Localized enlarged lymph nodes: Secondary | ICD-10-CM | POA: Diagnosis not present

## 2023-05-17 LAB — CBC WITH DIFFERENTIAL/PLATELET
Abs Immature Granulocytes: 0.02 10*3/uL (ref 0.00–0.07)
Basophils Absolute: 0.1 10*3/uL (ref 0.0–0.1)
Basophils Relative: 1 %
Eosinophils Absolute: 0.6 10*3/uL — ABNORMAL HIGH (ref 0.0–0.5)
Eosinophils Relative: 10 %
HCT: 45 % (ref 39.0–52.0)
Hemoglobin: 15 g/dL (ref 13.0–17.0)
Immature Granulocytes: 0 %
Lymphocytes Relative: 30 %
Lymphs Abs: 1.7 10*3/uL (ref 0.7–4.0)
MCH: 27.8 pg (ref 26.0–34.0)
MCHC: 33.3 g/dL (ref 30.0–36.0)
MCV: 83.5 fL (ref 80.0–100.0)
Monocytes Absolute: 0.5 10*3/uL (ref 0.1–1.0)
Monocytes Relative: 9 %
Neutro Abs: 2.8 10*3/uL (ref 1.7–7.7)
Neutrophils Relative %: 50 %
Platelets: 236 10*3/uL (ref 150–400)
RBC: 5.39 MIL/uL (ref 4.22–5.81)
RDW: 15.1 % (ref 11.5–15.5)
WBC: 5.6 10*3/uL (ref 4.0–10.5)
nRBC: 0 % (ref 0.0–0.2)

## 2023-05-17 LAB — BASIC METABOLIC PANEL
Anion gap: 7 (ref 5–15)
BUN: 13 mg/dL (ref 6–20)
CO2: 24 mmol/L (ref 22–32)
Calcium: 8.9 mg/dL (ref 8.9–10.3)
Chloride: 105 mmol/L (ref 98–111)
Creatinine, Ser: 1.07 mg/dL (ref 0.61–1.24)
GFR, Estimated: >60 mL/min (ref >60)
Glucose, Bld: 144 mg/dL — ABNORMAL HIGH (ref 70–99)
Potassium: 3.7 mmol/L (ref 3.5–5.1)
Sodium: 136 mmol/L (ref 135–145)

## 2023-05-17 MED ORDER — GRISEOFULVIN MICROSIZE 500 MG PO TABS: 1000.0000 mg | ORAL_TABLET | Freq: Every day | ORAL | 0 refills | Status: AC

## 2023-05-17 MED ORDER — KETOCONAZOLE 2 % EX SHAM: 1.0000 | MEDICATED_SHAMPOO | CUTANEOUS | 0 refills | Status: DC

## 2023-05-17 MED ORDER — IOHEXOL 300 MG/ML  SOLN
75.0000 mL | Freq: Once | INTRAMUSCULAR | Status: AC | PRN
  Administered 2023-05-17: 75 mL via INTRAVENOUS

## 2023-05-17 NOTE — ED Triage Notes (Signed)
The patient is complaining of swelling to the left side of his forehead and face for two days. He stated no injury. He stated pus comes out of the top of his scalp at night. No fever.

## 2023-05-17 NOTE — Discharge Instructions (Addendum)
We evaluated you for your scalp swelling and drainage.  I believe your symptoms are caused by a fungal infection called tinea capitis.  I have prescribed you an antifungal which must be taken for 4 weeks.  I have also prescribed you an antifungal shampoo.  Please schedule follow-up with your primary doctor.  On your CT scan we noticed you had some enlarged lymph nodes which are likely due to your scalp infection.  Your primary doctor may need to repeat your CT scan to make sure that these resolve.  If your symptoms do not improve, please follow-up with a dermatologist.  If you have any new or worsening symptoms such as fevers or chills, severe pain, bleeding, or any other concerning symptoms, please return to the emergency department.

## 2023-05-17 NOTE — ED Notes (Signed)
ED Provider at bedside. 

## 2023-05-17 NOTE — ED Provider Notes (Signed)
[facility] Provider Note  CSN: [id number] Arrival date & time: 05/17/23 4098  Chief Complaint(s) Facial Swelling  HPI Manuel Phillips is a 62 y.o. male presenting to the emergency department with facial swelling.  Patient reports area of swelling to the left side of his head face for 2 days.  No fevers.  Also reports drainage from his scalp.  No trouble breathing or trouble swallowing.  No nausea or vomiting.   Past Medical History History reviewed. No pertinent past medical history. There are no problems to display for this patient.  Home Medication(s) Prior to Admission medications   Medication Sig Start Date End Date Taking? Authorizing Provider  griseofulvin (GRIFULVIN V) 500 MG tablet Take 2 tablets (1,000 mg total) by mouth daily for 28 days. 05/17/23 06/14/23 Yes Lonell Grandchild, MD  ketoconazole (NIZORAL) 2 % shampoo Apply 1 Application topically 2 (two) times a week. 05/18/23  Yes Lonell Grandchild, MD  cyclobenzaprine (FLEXERIL) 10 MG tablet Take 1 tablet (10 mg total) by mouth 3 (three) times daily as needed for muscle spasms. 01/06/20   Molpus, John, MD  HYDROcodone-acetaminophen (NORCO) 5-325 MG tablet Take 1 tablet by mouth every 6 (six) hours as needed (for pain). 01/06/20   Molpus, John, MD                                                                                                                                    Past Surgical History Past Surgical History:  Procedure Laterality Date   CIRCUMCISION     at age 67   NO PAST SURGERIES     Family History Family History  Problem Relation Age of Onset   Heart failure Father    Colon cancer Neg Hx     Social History Social History   Tobacco Use   Smoking status: Never   Smokeless tobacco: Never  Vaping Use   Vaping status: Never Used  Substance Use Topics   Alcohol use: No    Alcohol/week: 0.0 standard drinks of alcohol   Drug use: No   Allergies Daucus  carota  Review of Systems Review of Systems  All other systems reviewed and are negative.   Physical Exam Vital Signs  I have reviewed the triage vital signs BP (!) 151/108   Pulse 72   Temp 97.9 F (36.6 C) (Oral)   Resp 18   Ht 5\' 8"  (1.727 m)   Wt 89 kg   SpO2 99%   BMI 29.83 kg/m  Physical Exam Vitals and nursing note reviewed.  Constitutional:      General: He is not in acute distress.    Appearance: Normal appearance.  HENT:     Head: Atraumatic.     Comments: Left temple area with swelling without really appreciable fluctuance or induration, no expressible purulence.  Scalp with crusting and flaking    Mouth/Throat:  Mouth: Mucous membranes are moist.  Eyes:     Conjunctiva/sclera: Conjunctivae normal.  Cardiovascular:     Rate and Rhythm: Normal rate.  Pulmonary:     Effort: Pulmonary effort is normal. No respiratory distress.  Abdominal:     General: Abdomen is flat.  Skin:    General: Skin is warm and dry.     Capillary Refill: Capillary refill takes less than 2 seconds.  Neurological:     General: No focal deficit present.     Mental Status: He is alert. Mental status is at baseline.  Psychiatric:        Mood and Affect: Mood normal.        Behavior: Behavior normal.     ED Results and Treatments Labs (all labs ordered are listed, but only abnormal results are displayed) Labs Reviewed  CBC WITH DIFFERENTIAL/PLATELET - Abnormal; Notable for the following components:      Result Value   Eosinophils Absolute 0.6 (*)    All other components within normal limits  BASIC METABOLIC PANEL - Abnormal; Notable for the following components:   Glucose, Bld 144 (*)    All other components within normal limits                                                                                                                          Radiology CT Maxillofacial W Contrast  Result Date: 05/17/2023 CLINICAL DATA:  facial swelling ?abscess. EXAM: CT  MAXILLOFACIAL WITH CONTRAST TECHNIQUE: Multidetector CT imaging of the maxillofacial structures was performed with intravenous contrast. Multiplanar CT image reconstructions were also generated. RADIATION DOSE REDUCTION: This exam was performed according to the departmental dose-optimization program which includes automated exposure control, adjustment of the mA and/or kV according to patient size and/or use of iterative reconstruction technique. CONTRAST:  75mL OMNIPAQUE IOHEXOL 300 MG/ML  SOLN COMPARISON:  None Available. FINDINGS: Osseous: No fracture or mandibular dislocation. No destructive process. Orbits: Negative. No traumatic or inflammatory finding. Sinuses: Largely clear sinuses. Soft tissues: Unusual appearance of diffuse left greater than right fairly scalp edema with relative sparing of the face and neck. No discrete, drainable fluid collection. Limited intracranial: No significant or unexpected finding. IMPRESSION: 1. Unusual appearance of diffuse left greater scalp edema with relative sparing of the face and neck, indeterminate but potentially related to infection/cellulitis, anasarca, or less likely angioedema. No visible discrete, drainable fluid collection identified. 2. Prominent upper cervical chain nodes bilaterally, nonspecific but potentially reactive given the above findings. Lymphoproliferative disorder is not excluded and clinical follow-up is recommended. Electronically Signed   By: Feliberto Harts M.D.   On: 05/17/2023 12:54    Pertinent labs & imaging results that were available during my care of the patient were reviewed by me and considered in my medical decision making (see MDM for details).  Medications Ordered in ED Medications  iohexol (OMNIPAQUE) 300 MG/ML solution 75 mL (75 mLs Intravenous Contrast Given 05/17/23 1156)  Procedures Procedures  (including critical care time)  Medical Decision Making / ED Course   MDM:  62 year old male presenting to the emergency department with swelling to head.  Seems most likely fungal infection such as tinea capitis or less likely seborrhea.  Area of swelling may be kerion.  It is kind of unusual so we will obtain CT scan to rule out abscess or other acute process.  Area of swelling is very distant from the airway and not consistent with angioedema or allergic reaction.  Clinical Course as of 05/17/23 1400  Sun May 17, 2023  1358 CT scan with no discrete fluid collection.  Does have scalp swelling.  Suspect tinea capitis.  Some enlarged lymph nodes were noted likely due to scalp infection.  Discussed with patient may need follow-up CT imaging for resolution of these to make sure they are not an occult malignancy doubt this.  Advise primary follow-up and possible dermatology referral if symptoms fail to improve.  Will prescribe griseofulvin and ketoconazole shampoo. Will discharge patient to home. All questions answered. Patient comfortable with plan of discharge. Return precautions discussed with patient and specified on the after visit summary.  [WS]    Clinical Course User Index [WS] Lonell Grandchild, MD     Additional history obtained: -Additional history obtained from spouse  Lab Tests: -I ordered, reviewed, and interpreted labs.   The pertinent results include:   Labs Reviewed  CBC WITH DIFFERENTIAL/PLATELET - Abnormal; Notable for the following components:      Result Value   Eosinophils Absolute 0.6 (*)    All other components within normal limits  BASIC METABOLIC PANEL - Abnormal; Notable for the following components:   Glucose, Bld 144 (*)    All other components within normal limits    Notable for mild elevated eosinophils. No leukocytosis   Imaging Studies ordered: I ordered imaging studies including CT scan On my interpretation imaging  demonstrates scalp swelling and LAD I independently visualized and interpreted imaging. I agree with the radiologist interpretation   Medicines ordered and prescription drug management: Meds ordered this encounter  Medications   iohexol (OMNIPAQUE) 300 MG/ML solution 75 mL   griseofulvin (GRIFULVIN V) 500 MG tablet    Sig: Take 2 tablets (1,000 mg total) by mouth daily for 28 days.    Dispense:  56 tablet    Refill:  0   ketoconazole (NIZORAL) 2 % shampoo    Sig: Apply 1 Application topically 2 (two) times a week.    Dispense:  120 mL    Refill:  0    -I have reviewed the patients home medicines and have made adjustments as needed  Co morbidities that complicate the patient evaluation History reviewed. No pertinent past medical history.    Dispostion: Disposition decision including need for hospitalization was considered, and patient discharged from emergency department.    Final Clinical Impression(s) / ED Diagnoses Final diagnoses:  Tinea capitis     This chart was dictated using voice recognition software.  Despite best efforts to proofread,  errors can occur which can change the documentation meaning.    Lonell Grandchild, MD 05/17/23 1400

## 2023-05-18 ENCOUNTER — Encounter (HOSPITAL_COMMUNITY): Payer: Self-pay

## 2023-05-18 ENCOUNTER — Emergency Department (HOSPITAL_COMMUNITY)
Admission: EM | Admit: 2023-05-18 | Discharge: 2023-05-18 | Disposition: A | Discharge status: Home / Self Care | Payer: 59 | Attending: Emergency Medicine | Admitting: Emergency Medicine

## 2023-05-18 ENCOUNTER — Other Ambulatory Visit: Payer: Self-pay

## 2023-05-18 DIAGNOSIS — R21 Rash and other nonspecific skin eruption: Secondary | ICD-10-CM | POA: Diagnosis not present

## 2023-05-18 DIAGNOSIS — L243 Irritant contact dermatitis due to cosmetics: Secondary | ICD-10-CM | POA: Insufficient documentation

## 2023-05-18 DIAGNOSIS — L232 Allergic contact dermatitis due to cosmetics: Secondary | ICD-10-CM | POA: Diagnosis not present

## 2023-05-18 MED ORDER — METHYLPREDNISOLONE 4 MG PO TBPK: ORAL_TABLET | ORAL | 0 refills | Status: DC

## 2023-05-18 MED ORDER — KETOROLAC TROMETHAMINE 60 MG/2ML IM SOLN
60.0000 mg | Freq: Once | INTRAMUSCULAR | Status: AC
  Administered 2023-05-18: 60 mg via INTRAMUSCULAR
  Filled 2023-05-18: qty 2

## 2023-05-18 MED ORDER — DOXYCYCLINE HYCLATE 100 MG PO CAPS: 100.0000 mg | ORAL_CAPSULE | Freq: Two times a day (BID) | ORAL | 0 refills | Status: DC

## 2023-05-18 MED ORDER — DEXAMETHASONE SODIUM PHOSPHATE 10 MG/ML IJ SOLN
10.0000 mg | Freq: Once | INTRAMUSCULAR | Status: AC
  Administered 2023-05-18: 10 mg via INTRAMUSCULAR
  Filled 2023-05-18: qty 1

## 2023-05-18 NOTE — Discharge Instructions (Signed)
Contact a health care provider if: You have a fever. Your symptoms do not improve within 1-2 days of starting treatment or you develop new symptoms. Your bone or joint underneath the infected area becomes painful after the skin has healed. Your infection returns in the same area or another area. Signs of this may include: You notice a swollen bump in the infected area. Your red area gets larger, turns dark in color, or becomes more painful. Drainage increases. Pus or a bad smell develops in your infected area. You have more pain. You feel ill and have muscle aches and weakness. You develop vomiting or diarrhea that will not go away. Get help right away if: You notice red streaks coming from the infected area. You notice the skin turns purple or black and falls off. 

## 2023-05-18 NOTE — ED Triage Notes (Signed)
Patient used hair dye on his hair 1 week ago and had an allergic reaction to it. Causing facial swelling. Yesterday went to the ED and was prescribed Griseofulvin. Today feels like the swelling is not going down. Now feels like both eyes are swelling.

## 2023-05-18 NOTE — ED Provider Notes (Signed)
[place] EMERGENCY DEPARTMENT AT [facility] Provider Note   CSN: [id number] Arrival date & time: 05/18/23  1329     History  Chief Complaint  Patient presents with   Allergic Reaction    Manuel Phillips is a 62 y.o. male who presents emergency department for reevaluation of scalp rash.  He was seen yesterday for for the same issue. He had a CT scan that showed diffuse swelling of the scalp which has now worsened and spread to the patient's face.  Patient also complains of severe pain in his scalp.  He was diagnosed with tinea capitis and took griseofulvin but his symptoms seem to have worsened.  After he used hair dye several days ago.  He has been having swelling, pain, redness, cracking of the skin, dry cracking of the ears and neck where the dye touched his skin.  He has also had some itching as well.                                                      Allergic Reaction      Home Medications Prior to Admission medications   Medication Sig Start Date End Date Taking? Authorizing Provider  cyclobenzaprine (FLEXERIL) 10 MG tablet Take 1 tablet (10 mg total) by mouth 3 (three) times daily as needed for muscle spasms. 01/06/20   Molpus, John, MD  griseofulvin (GRIFULVIN V) 500 MG tablet Take 2 tablets (1,000 mg total) by mouth daily for 28 days. 05/17/23 06/14/23  Lonell Grandchild, MD  HYDROcodone-acetaminophen (NORCO) 5-325 MG tablet Take 1 tablet by mouth every 6 (six) hours as needed (for pain). 01/06/20   Molpus, John, MD  ketoconazole (NIZORAL) 2 % shampoo Apply 1 Application topically 2 (two) times a week. 05/18/23   Lonell Grandchild, MD      Allergies    Daucus carota    Review of Systems   Review of Systems  Physical Exam Updated Vital Signs BP (!) 135/100   Pulse 67   Temp 98.4 F (36.9 C) (Oral)   Resp 17   Ht 5\' 8"  (1.727 m)   Wt 89 kg   SpO2 97%   BMI 29.83 kg/m  Physical Exam Vitals and nursing note reviewed.  Constitutional:      General: He  is not in acute distress.    Appearance: He is well-developed. He is not diaphoretic.  HENT:     Head: Normocephalic and atraumatic.     Comments: Diffuse scaly hypertrophic skin changes across the scalp there are microtears with no active bleeding but underlying tissue is visible.  It is diffusely tender and edematous and there is significant swelling on the left temporal region extending to the left face and eye. Eyes:     General: No scleral icterus.    Conjunctiva/sclera: Conjunctivae normal.  Cardiovascular:     Rate and Rhythm: Normal rate and regular rhythm.     Heart sounds: Normal heart sounds.  Pulmonary:     Effort: Pulmonary effort is normal. No respiratory distress.     Breath sounds: Normal breath sounds.  Abdominal:     Palpations: Abdomen is soft.     Tenderness: There is no abdominal tenderness.  Musculoskeletal:     Cervical back: Normal range of motion and neck supple.  Skin:  General: Skin is warm and dry.  Neurological:     Mental Status: He is alert.  Psychiatric:        Behavior: Behavior normal.          ED Results / Procedures / Treatments   Labs (all labs ordered are listed, but only abnormal results are displayed) Labs Reviewed - No data to display  EKG None  Radiology CT Maxillofacial W Contrast  Result Date: 05/17/2023 CLINICAL DATA:  facial swelling ?abscess. EXAM: CT MAXILLOFACIAL WITH CONTRAST TECHNIQUE: Multidetector CT imaging of the maxillofacial structures was performed with intravenous contrast. Multiplanar CT image reconstructions were also generated. RADIATION DOSE REDUCTION: This exam was performed according to the departmental dose-optimization program which includes automated exposure control, adjustment of the mA and/or kV according to patient size and/or use of iterative reconstruction technique. CONTRAST:  75mL OMNIPAQUE IOHEXOL 300 MG/ML  SOLN COMPARISON:  None Available. FINDINGS: Osseous: No fracture or mandibular  dislocation. No destructive process. Orbits: Negative. No traumatic or inflammatory finding. Sinuses: Largely clear sinuses. Soft tissues: Unusual appearance of diffuse left greater than right fairly scalp edema with relative sparing of the face and neck. No discrete, drainable fluid collection. Limited intracranial: No significant or unexpected finding. IMPRESSION: 1. Unusual appearance of diffuse left greater scalp edema with relative sparing of the face and neck, indeterminate but potentially related to infection/cellulitis, anasarca, or less likely angioedema. No visible discrete, drainable fluid collection identified. 2. Prominent upper cervical chain nodes bilaterally, nonspecific but potentially reactive given the above findings. Lymphoproliferative disorder is not excluded and clinical follow-up is recommended. Electronically Signed   By: Feliberto Harts M.D.   On: 05/17/2023 12:54    Procedures Procedures    Medications Ordered in ED Medications  ketorolac (TORADOL) injection 60 mg (60 mg Intramuscular Given 05/18/23 1517)  dexamethasone (DECADRON) injection 10 mg (10 mg Intramuscular Given 05/18/23 1517)    ED Course/ Medical Decision Making/ A&P                                 Medical Decision Making Risk Prescription drug management.   For reassessment of scalp pain swelling and rash.  Suspect that this is severe contact dermatitis due to cosmetic irritant.  Patient also has significant swelling.  Although he does not have significant tenderness in the face or around the eye he does have swelling.  Will cover for secondary infection with doxycycline.  Patient given a shot of Toradol and Decadron here in the emergency department with discharged on Medrol Dosepak and doxycycline.  Discussed outpatient follow-up and return precautions.       Final Clinical Impression(s) / ED Diagnoses Final diagnoses:  Irritant contact dermatitis due to cosmetics    Rx / DC Orders ED  Discharge Orders     None         Arthor Captain, PA-C 05/18/23 1600    Lonell Grandchild, MD 05/18/23 1627

## 2023-06-03 ENCOUNTER — Telehealth: Payer: Self-pay

## 2023-06-03 NOTE — Telephone Encounter (Signed)
Transition Care Management Unsuccessful Follow-up Telephone Call  Date of discharge and from where:  05/18/2023 [facility]  Attempts:  2nd Attempt  Reason for unsuccessful TCM follow-up call:  Left voice message  Telford Archambeau [facility]  [facility], [facility] Resource Care Guide Direct Dial: [phone]  Website: [email or web address]

## 2023-06-03 NOTE — Telephone Encounter (Signed)
Transition Care Management Unsuccessful Follow-up Telephone Call  Date of discharge and from where:  05/18/2023 [facility]  Attempts:  1st Attempt  Reason for unsuccessful TCM follow-up call:  Left voice message  Josselin Gaulin [facility]  [facility], [facility] Resource Care Guide Direct Dial: [phone]  Website: [email or web address]

## 2024-06-22 ENCOUNTER — Ambulatory Visit (HOSPITAL_COMMUNITY)
Admission: EM | Admit: 2024-06-22 | Discharge: 2024-06-22 | Disposition: A | Discharge status: Home / Self Care | Attending: Emergency Medicine | Admitting: Emergency Medicine

## 2024-06-22 ENCOUNTER — Encounter (HOSPITAL_COMMUNITY): Payer: Self-pay

## 2024-06-22 DIAGNOSIS — M549 Dorsalgia, unspecified: Secondary | ICD-10-CM

## 2024-06-22 MED ORDER — DEXAMETHASONE SODIUM PHOSPHATE 10 MG/ML IJ SOLN
INTRAMUSCULAR | Status: AC
  Filled 2024-06-22: qty 1

## 2024-06-22 MED ORDER — DEXAMETHASONE SODIUM PHOSPHATE 10 MG/ML IJ SOLN
10.0000 mg | Freq: Once | INTRAMUSCULAR | Status: AC
  Administered 2024-06-22: 10 mg via INTRAMUSCULAR

## 2024-06-22 MED ORDER — METHOCARBAMOL 500 MG PO TABS: 500.0000 mg | ORAL_TABLET | Freq: Two times a day (BID) | ORAL | 0 refills | Status: DC

## 2024-06-22 NOTE — ED Triage Notes (Signed)
 Patient here today with c/o upper back pain X 2 weeks. He has tried using Icy/hot with little relief.

## 2024-06-22 NOTE — Discharge Instructions (Addendum)
 Your back pain appears to be muscular.  He can take the Robaxin  up to 2 times daily.  Continue to heat, massage and gently stretch the area.  The steroid shot should help with pain and inflammation.  If you continue to have back pain despite these interventions please follow-up with sports medicine for further evaluation.

## 2024-06-22 NOTE — ED Provider Notes (Signed)
 [facility]    CSN: [id number] Arrival date & time: 06/22/24  1543      History   Chief Complaint Chief Complaint  Patient presents with   Back Pain    HPI Manuel Phillips is a 63 y.o. male.   Patient presents to clinic over concern of midline upper back pain has been present for the past 2 weeks.  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  He had the last week off so he was trying to use topical IcyHot and relax the muscles.  Did try 1 muscle relaxer that seem to help, this was his wife's prescription.   Has not had any trauma, falls or injuries.  Does notice pain is reproducible with certain movements such as posture and range of motion.  Has not had any rashes.  Denies chest pain, shortness of breath or wheezing.  Denies cough.  Denies recent illness.  The history is provided by the patient and medical records.  Back Pain   History reviewed. No pertinent past medical history.  There are no active problems to display for this patient.   Past Surgical History:  Procedure Laterality Date   CIRCUMCISION     at age 83   NO PAST SURGERIES         Home Medications    Prior to Admission medications   Medication Sig Start Date End Date Taking? Authorizing Provider  methocarbamol  (ROBAXIN ) 500 MG tablet Take 1 tablet (500 mg total) by mouth 2 (two) times daily. 06/22/24  Yes Marek Nghiem  N, FNP    Family History Family History  Problem Relation Age of Onset   Heart failure Father    Colon cancer Neg Hx     Social History Social History   Tobacco Use   Smoking status: Never   Smokeless tobacco: Never  Vaping Use   Vaping status: Never Used  Substance Use Topics   Alcohol use: No    Alcohol/week: 0.0 standard drinks of alcohol   Drug use: No     Allergies   Daucus carota   Review of Systems Review of Systems  Per HPI  Physical Exam Triage Vital Signs ED Triage  Vitals  Encounter Vitals Group     BP 06/22/24 1719 (!) 158/109     Girls Systolic BP Percentile --      Girls Diastolic BP Percentile --      Boys Systolic BP Percentile --      Boys Diastolic BP Percentile --      Pulse Rate 06/22/24 1719 80     Resp 06/22/24 1719 16     Temp 06/22/24 1719 98 F (36.7 C)     Temp Source 06/22/24 1719 Oral     SpO2 06/22/24 1719 96 %     Weight --      Height --      Head Circumference --      Peak Flow --      Pain Score 06/22/24 1721 8     Pain Loc --      Pain Education --      Exclude from Growth Chart --    No data found.  Updated Vital Signs BP (!) 158/109 (BP Location: Right Arm)   Pulse 80   Temp 98 F (36.7 C) (Oral)   Resp 16   SpO2 96%   Visual Acuity Right Eye Distance:  Left Eye Distance:   Bilateral Distance:    Right Eye Near:   Left Eye Near:    Bilateral Near:     Physical Exam Vitals and nursing note reviewed.  Constitutional:      Appearance: Normal appearance.  HENT:     Head: Normocephalic and atraumatic.     Right Ear: External ear normal.     Left Ear: External ear normal.     Nose: Nose normal.     Mouth/Throat:     Mouth: Mucous membranes are moist.  Eyes:     Conjunctiva/sclera: Conjunctivae normal.  Cardiovascular:     Rate and Rhythm: Normal rate.  Pulmonary:     Effort: Pulmonary effort is normal. No respiratory distress.  Musculoskeletal:        General: No swelling, tenderness or deformity.     Cervical back: Normal range of motion.     Thoracic back: Normal. No tenderness.     Lumbar back: Normal. No tenderness.       Back:     Comments: Midline upper back pain, spine without step-off, tenderness or deformity.  Surrounding paraspinous muscles are tender to palpation and with certain range of motion's.  Skin:    General: Skin is warm and dry.     Findings: No rash.  Neurological:     General: No focal deficit present.     Mental Status: He is alert and oriented to person, place,  and time.  Psychiatric:        Mood and Affect: Mood normal.        Behavior: Behavior normal.      UC Treatments / Results  Labs (all labs ordered are listed, but only abnormal results are displayed) Labs Reviewed - No data to display  EKG   Radiology No results found.  Procedures Procedures (including critical care time)  Medications Ordered in UC Medications  dexamethasone  (DECADRON ) injection 10 mg (10 mg Intramuscular Given 06/22/24 1801)    Initial Impression / Assessment and Plan / UC Course  I have reviewed the triage vital signs and the nursing notes.  Pertinent labs & imaging results that were available during my care of the patient were reviewed by me and considered in my medical decision making (see chart for details).  Vitals and triage reviewed, patient is hemodynamically stable.  Appears to have soft tissue upper back pain, will treat with IM steroid and muscle relaxer.  Lungs vesicular, heart with regular rate and rhythm.  Without chest pain or shortness of breath, low concern for cardiac etiology at this time.  Symptomatic management discussed.  Plan of care, follow-up care return precautions given, no questions at this time.    Final Clinical Impressions(s) / UC Diagnoses   Final diagnoses:  Upper back pain     Discharge Instructions      Your back pain appears to be muscular.  He can take the Robaxin  up to 2 times daily.  Continue to heat, massage and gently stretch the area.  The steroid shot should help with pain and inflammation.  If you continue to have back pain despite these interventions please follow-up with sports medicine for further evaluation.    ED Prescriptions     Medication Sig Dispense Auth. Provider   methocarbamol  (ROBAXIN ) 500 MG tablet Take 1 tablet (500 mg total) by mouth 2 (two) times daily. 20 tablet Dreama, Josedaniel Haye  N, FNP      PDMP not reviewed this encounter.   Dreama, Vanna Sailer  N,  FNP 06/22/24 1844

## 2024-09-30 ENCOUNTER — Encounter (HOSPITAL_COMMUNITY): Payer: Self-pay

## 2024-09-30 ENCOUNTER — Ambulatory Visit (HOSPITAL_COMMUNITY)
Admission: EM | Admit: 2024-09-30 | Discharge: 2024-09-30 | Disposition: A | Discharge status: Home / Self Care | Attending: Family Medicine | Admitting: Family Medicine

## 2024-09-30 DIAGNOSIS — K047 Periapical abscess without sinus: Secondary | ICD-10-CM | POA: Diagnosis not present

## 2024-09-30 MED ORDER — KETOROLAC TROMETHAMINE 30 MG/ML IJ SOLN
30.0000 mg | Freq: Once | INTRAMUSCULAR | Status: AC
  Administered 2024-09-30: 30 mg via INTRAMUSCULAR

## 2024-09-30 MED ORDER — AMOXICILLIN-POT CLAVULANATE 875-125 MG PO TABS: 1.0000 | ORAL_TABLET | Freq: Two times a day (BID) | ORAL | 0 refills | Status: AC

## 2024-09-30 MED ORDER — KETOROLAC TROMETHAMINE 10 MG PO TABS: 10.0000 mg | ORAL_TABLET | Freq: Four times a day (QID) | ORAL | 0 refills | Status: AC | PRN

## 2024-09-30 MED ORDER — KETOROLAC TROMETHAMINE 30 MG/ML IJ SOLN
INTRAMUSCULAR | Status: AC
  Filled 2024-09-30: qty 1

## 2024-09-30 NOTE — ED Triage Notes (Signed)
 Pt c/o rt upper toothache with facial swelling x2 days. States took tylenol  with little relief.

## 2024-09-30 NOTE — Discharge Instructions (Signed)
 You have been given a shot of Toradol  30 mg today.  Take amoxicillin -clavulanate 875 mg--1 tab twice daily with food for 7 days  Ketorolac  10 mg tablets--take 1 tablet every 6 hours as needed for pain.  This is the same medicine that is in the shot we just gave you  I am glad you are going to follow-up with the dentist

## 2024-09-30 NOTE — ED Provider Notes (Signed)
 " [facility]    CSN: [id number] Arrival date & time: 09/30/24  1737      History   Chief Complaint Chief Complaint  Patient presents with   Dental Pain    HPI Manuel Phillips is a 64 y.o. male.    Dental Pain  Here for pain in his right upper teeth and swelling of the right cheek. It began bothering him 2-3 days ago, but got a lot worse last night. No fever.   NKDA   History reviewed. No pertinent past medical history.  There are no active problems to display for this patient.   Past Surgical History:  Procedure Laterality Date   CIRCUMCISION     at age 40   NO PAST SURGERIES         Home Medications    Prior to Admission medications  Medication Sig Start Date End Date Taking? Authorizing Provider  amoxicillin -clavulanate (AUGMENTIN ) 875-125 MG tablet Take 1 tablet by mouth 2 (two) times daily for 7 days. 09/30/24 10/07/24 Yes Vonna Sharlet POUR, MD  ketorolac  (TORADOL ) 10 MG tablet Take 1 tablet (10 mg total) by mouth every 6 (six) hours as needed (pain). 09/30/24  Yes Elliyah Liszewski, Sharlet POUR, MD    Family History Family History  Problem Relation Age of Onset   Heart failure Father    Colon cancer Neg Hx     Social History Social History[1]   Allergies   Daucus carota   Review of Systems Review of Systems   Physical Exam Triage Vital Signs ED Triage Vitals  Encounter Vitals Group     BP 09/30/24 1855 (!) 166/94     Girls Systolic BP Percentile --      Girls Diastolic BP Percentile --      Boys Systolic BP Percentile --      Boys Diastolic BP Percentile --      Pulse Rate 09/30/24 1855 92     Resp 09/30/24 1855 18     Temp 09/30/24 1855 98.2 F (36.8 C)     Temp src --      SpO2 09/30/24 1855 96 %     Weight --      Height --      Head Circumference --      Peak Flow --      Pain Score 09/30/24 1854 8     Pain Loc --      Pain Education --      Exclude from Growth Chart --    No data found.  Updated Vital Signs BP (!)  166/94 (BP Location: Right Arm)   Pulse 92   Temp 98.2 F (36.8 C)   Resp 18   SpO2 96%   Visual Acuity Right Eye Distance:   Left Eye Distance:   Bilateral Distance:    Right Eye Near:   Left Eye Near:    Bilateral Near:     Physical Exam Vitals reviewed.  Constitutional:      General: He is not in acute distress.    Appearance: He is not ill-appearing, toxic-appearing or diaphoretic.  HENT:     Head:     Comments: There is mild puffiness of the right mid cheek. No fluctuance    Mouth/Throat:     Mouth: Mucous membranes are moist.     Comments: There are carious teeth. No edema in the mouth. Eyes:     Extraocular Movements: Extraocular movements intact.     Conjunctiva/sclera: Conjunctivae normal.  Pupils: Pupils are equal, round, and reactive to light.  Skin:    Coloration: Skin is not pale.  Neurological:     General: No focal deficit present.     Mental Status: He is alert.  Psychiatric:        Behavior: Behavior normal.      UC Treatments / Results  Labs (all labs ordered are listed, but only abnormal results are displayed) Labs Reviewed - No data to display  EKG   Radiology No results found.  Procedures Procedures (including critical care time)  Medications Ordered in UC Medications  ketorolac  (TORADOL ) 30 MG/ML injection 30 mg (has no administration in time range)    Initial Impression / Assessment and Plan / UC Course  I have reviewed the triage vital signs and the nursing notes.  Pertinent labs & imaging results that were available during my care of the patient were reviewed by me and considered in my medical decision making (see chart for details).     Augmentin  is sent in for the dental infection and toradol  injection is given here; toradol  tabs sent to the pharmacy for pain at home. 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  Take amoxicillin -clavulanate 875 mg--1 tab twice daily with food for 7 days  Ketorolac  10 mg tablets--take 1 tablet every 6 hours as needed for pain.  This is the same medicine that is in the shot we just gave you  I am glad you are going to follow-up with the dentist    ED Prescriptions     Medication Sig Dispense Auth. Provider   amoxicillin -clavulanate (AUGMENTIN ) 875-125 MG tablet Take 1 tablet by mouth 2 (two) times daily for 7 days. 14 tablet Daizee Firmin K, MD   ketorolac  (TORADOL ) 10 MG tablet Take 1 tablet (10 mg total) by mouth every 6 (six) hours as needed (pain). 20 tablet Avigdor Dollar K, MD      PDMP not reviewed this encounter.    [1]  Social History Tobacco Use   Smoking status: Never   Smokeless tobacco: Never  Vaping Use   Vaping status: Never Used  Substance Use Topics   Alcohol use: No    Alcohol/week: 0.0 standard drinks of alcohol   Drug use: No     Vonna Sharlet POUR, MD 09/30/24 1909  "
